# Patient Record
Sex: Male | Born: 1970 | Race: Black or African American | Hispanic: No | Marital: Single | Smoking: Current every day smoker
Health system: Southern US, Community
[De-identification: ages and names within clinical notes are randomized; demographics above are authoritative.]

## PROBLEM LIST (undated history)

## (undated) DIAGNOSIS — G4730 Sleep apnea, unspecified: Secondary | ICD-10-CM

---

## 1999-06-21 ENCOUNTER — Emergency Department (HOSPITAL_COMMUNITY): Admission: EM | Admit: 1999-06-21 | Discharge: 1999-06-21 | Payer: Self-pay | Admitting: Emergency Medicine

## 2000-03-25 ENCOUNTER — Emergency Department (HOSPITAL_COMMUNITY): Admission: EM | Admit: 2000-03-25 | Discharge: 2000-03-25 | Payer: Self-pay | Admitting: *Deleted

## 2001-04-07 ENCOUNTER — Encounter: Payer: Self-pay | Admitting: Emergency Medicine

## 2001-04-07 ENCOUNTER — Emergency Department (HOSPITAL_COMMUNITY): Admission: EM | Admit: 2001-04-07 | Discharge: 2001-04-07 | Payer: Self-pay | Admitting: Emergency Medicine

## 2008-12-11 ENCOUNTER — Emergency Department (HOSPITAL_COMMUNITY): Admission: EM | Admit: 2008-12-11 | Discharge: 2008-12-11 | Payer: Self-pay | Admitting: Emergency Medicine

## 2009-01-12 ENCOUNTER — Emergency Department (HOSPITAL_COMMUNITY): Admission: EM | Admit: 2009-01-12 | Discharge: 2009-01-12 | Payer: Self-pay | Admitting: Emergency Medicine

## 2009-01-16 ENCOUNTER — Ambulatory Visit (HOSPITAL_COMMUNITY): Admission: RE | Admit: 2009-01-16 | Discharge: 2009-01-16 | Payer: Self-pay | Admitting: Orthopedic Surgery

## 2009-04-30 ENCOUNTER — Emergency Department (HOSPITAL_COMMUNITY): Admission: EM | Admit: 2009-04-30 | Discharge: 2009-04-30 | Payer: Self-pay | Admitting: Emergency Medicine

## 2009-06-05 ENCOUNTER — Emergency Department (HOSPITAL_COMMUNITY): Admission: EM | Admit: 2009-06-05 | Discharge: 2009-06-05 | Payer: Self-pay | Admitting: Emergency Medicine

## 2009-07-07 ENCOUNTER — Ambulatory Visit: Payer: Self-pay | Admitting: Cardiology

## 2009-07-07 DIAGNOSIS — R55 Syncope and collapse: Secondary | ICD-10-CM

## 2009-07-22 ENCOUNTER — Ambulatory Visit (HOSPITAL_BASED_OUTPATIENT_CLINIC_OR_DEPARTMENT_OTHER): Admission: RE | Admit: 2009-07-22 | Discharge: 2009-07-22 | Payer: Self-pay | Admitting: Cardiology

## 2009-07-22 ENCOUNTER — Encounter: Payer: Self-pay | Admitting: Cardiology

## 2009-08-02 ENCOUNTER — Ambulatory Visit: Payer: Self-pay | Admitting: Pulmonary Disease

## 2009-08-10 ENCOUNTER — Ambulatory Visit: Payer: Self-pay

## 2009-08-10 ENCOUNTER — Encounter: Payer: Self-pay | Admitting: Cardiology

## 2009-08-10 ENCOUNTER — Ambulatory Visit: Payer: Self-pay | Admitting: Internal Medicine

## 2009-08-10 ENCOUNTER — Ambulatory Visit (HOSPITAL_COMMUNITY): Admission: RE | Admit: 2009-08-10 | Discharge: 2009-08-10 | Payer: Self-pay | Admitting: Cardiology

## 2009-08-11 ENCOUNTER — Telehealth: Payer: Self-pay | Admitting: Cardiology

## 2009-08-20 ENCOUNTER — Ambulatory Visit: Payer: Self-pay | Admitting: Pulmonary Disease

## 2009-08-20 DIAGNOSIS — G4733 Obstructive sleep apnea (adult) (pediatric): Secondary | ICD-10-CM | POA: Insufficient documentation

## 2009-08-24 ENCOUNTER — Encounter: Payer: Self-pay | Admitting: Pulmonary Disease

## 2009-08-25 LAB — CONVERTED CEMR LAB: TSH: 0.32 microintl units/mL — ABNORMAL LOW (ref 0.35–5.50)

## 2009-10-19 ENCOUNTER — Emergency Department (HOSPITAL_COMMUNITY): Admission: EM | Admit: 2009-10-19 | Discharge: 2009-10-19 | Payer: Self-pay | Admitting: Emergency Medicine

## 2009-10-28 ENCOUNTER — Ambulatory Visit: Payer: Self-pay | Admitting: Pulmonary Disease

## 2009-11-19 ENCOUNTER — Telehealth: Payer: Self-pay | Admitting: Pulmonary Disease

## 2009-12-04 IMAGING — CR DG KNEE COMPLETE 4+V*R*
4 series · 4 of 4 positions shown · non-contrast
Comparison: None

CLINICAL DATA: Fall with right knee pain.

RIGHT KNEE - COMPLETE 4+ VIEW

[t knee ap right]
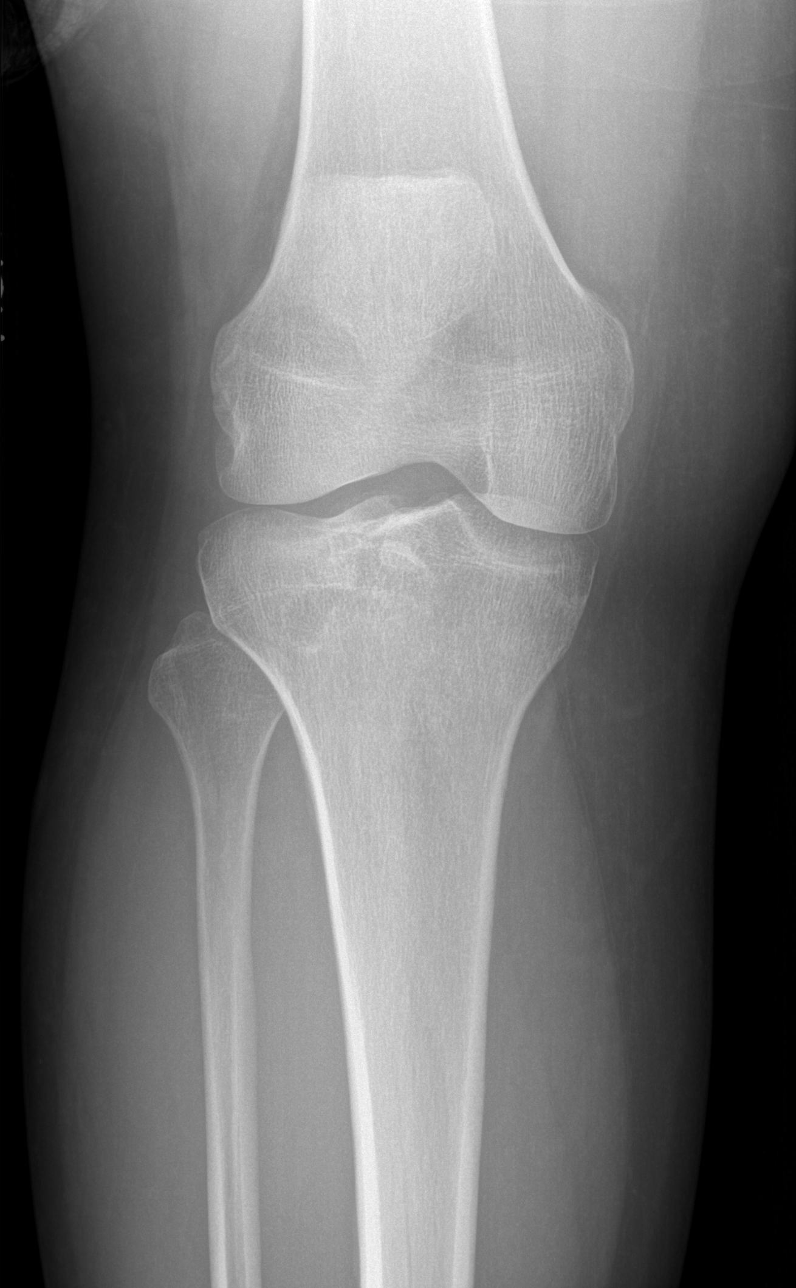

[t knee oblique right (1 of 2)]
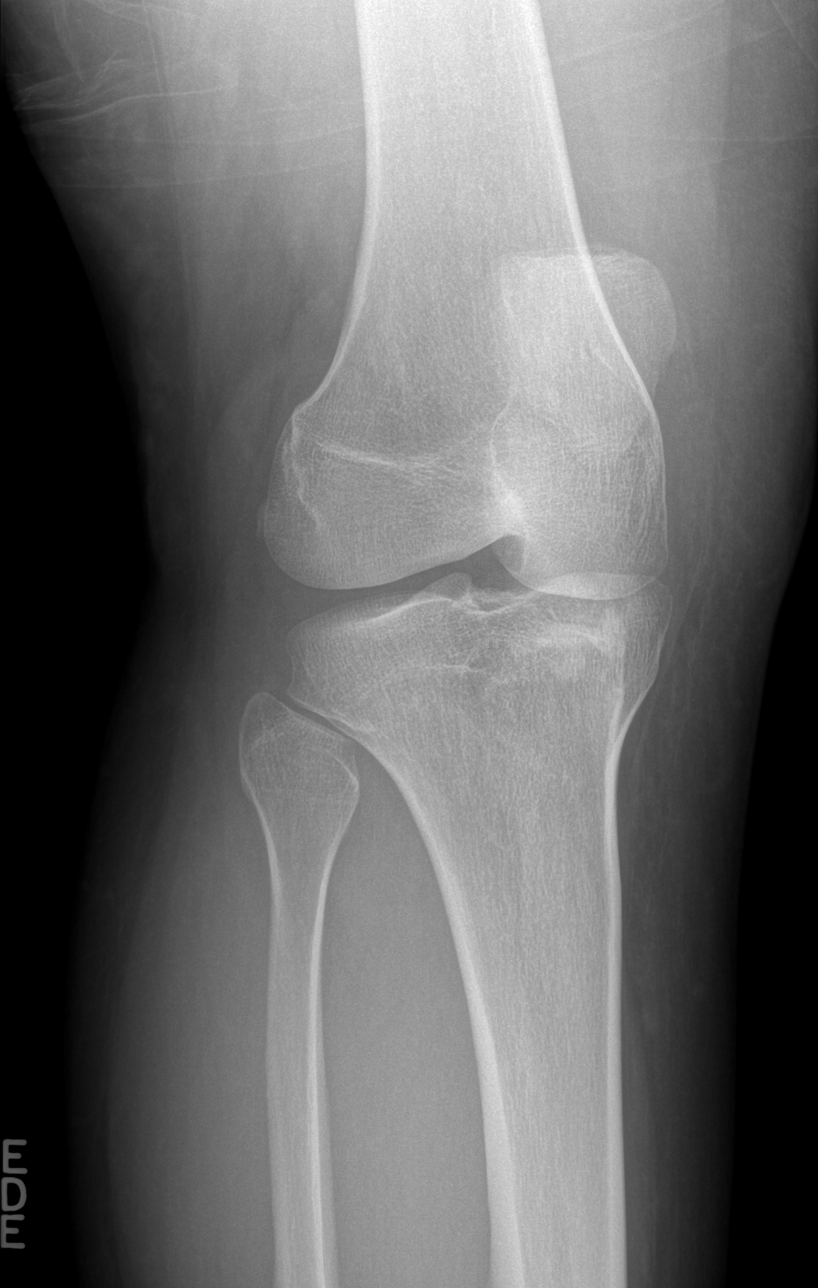

[t knee oblique right (2 of 2)]
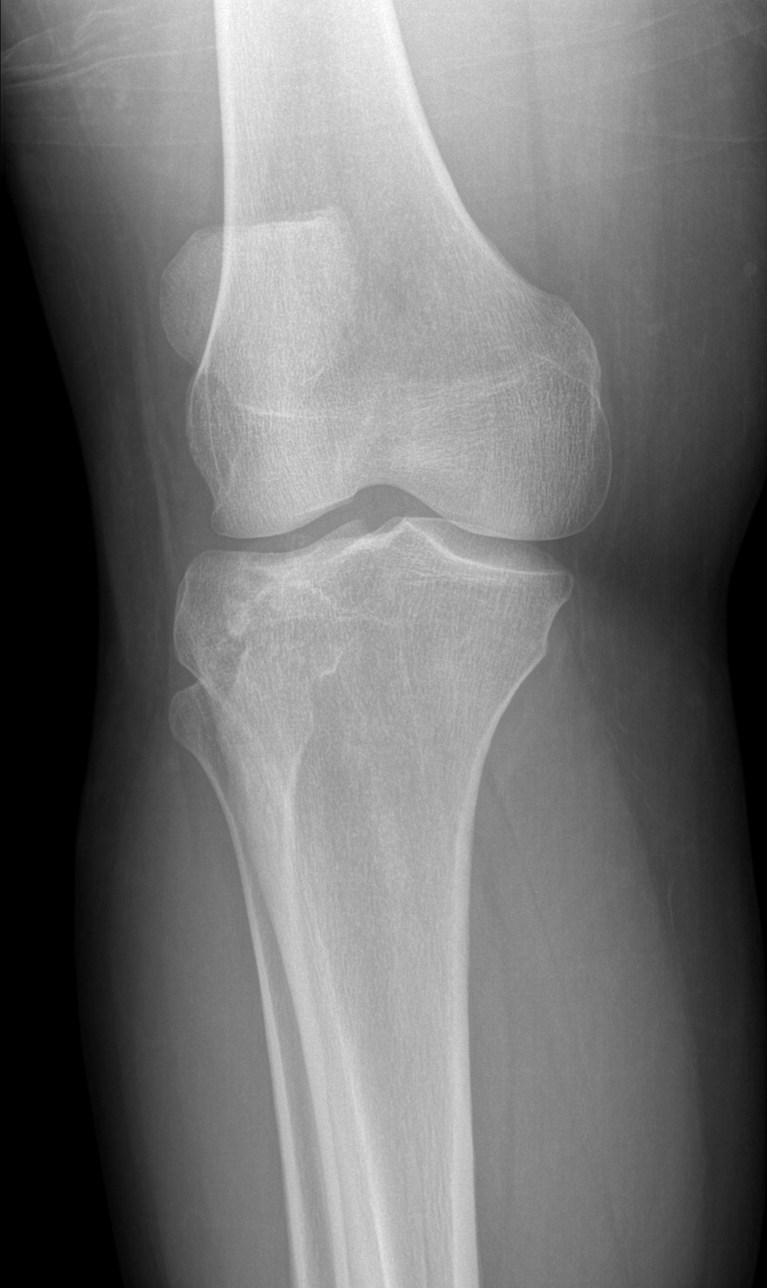

[t knee lat right]
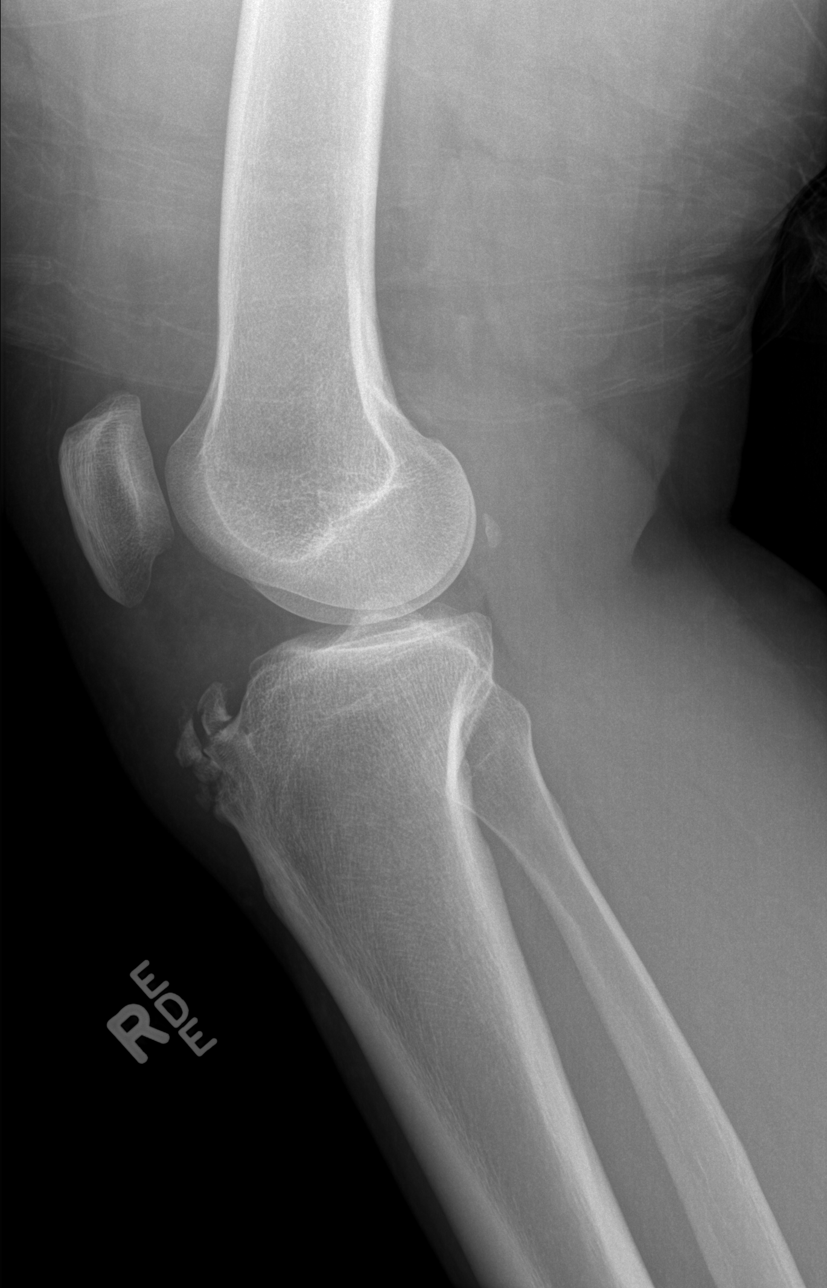

[4 of 4 positions shown; findings below may reference images not displayed]

FINDINGS: A fracture of the anterior tibial tubercle is noted.
Overlying soft tissue swelling is present.
There is no evidence of subluxation or dislocation.
There may be a very small knee effusion present.
IMPRESSION: Fracture of the anterior tibial tubercle.

## 2010-05-03 ENCOUNTER — Telehealth: Payer: Self-pay | Admitting: Pulmonary Disease

## 2010-07-13 NOTE — Assessment & Plan Note (Signed)
Summary: np6/see er report/jss   Primary Provider:  None   History of Present Illness: 40 yo presents for evaluation of syncope.  Patient has had 5-6 episodes of syncope.  Episodes have all been associated with either hard coughing or hard laughing.  Most recent episode was Christmas Eve.  He was sitting at a table with his girlfriend and got to laughing really hard.  He stood up still laughing and felt lightheaded.  He passed out while walking to the kitchen for a brief period.  Other episodes have occurred either during a "coughing fit" or when laughing really hard.  No micturation or defecation-induced syncope.  He has had a few episodes where he has been coughing and has felt lightheaded and laid down with resolution of the symptoms without syncope.    Patient reports significant daytime sleepiness.  His girlfriend says that he stops breathing sometimes during sleep then gasps.    ECG: NSR, normal  Current Medications (verified): 1)  None  Allergies (verified): No Known Drug Allergies  Past History:  Past Medical History: 1.  Obesity 2.  Syncope: cough and laughter induced situational syncope  Family History: No premature CAD  Social History: LIves with girlfriend.  Smokes < 1 ppd.  He is unemployed (laid off).    Review of Systems       All systems reviewed and negative except as per HPI.   Vital Signs:  Patient profile:   40 year old male Weight:      281.8 pounds Pulse rate:   95 / minute Pulse rhythm:   regular BP sitting:   140 / 89  (right arm)  Vitals Entered By: Minerva Areola, RN, BSN (July 07, 2009 12:39 PM)  Physical Exam  General:  Well developed, well nourished, in no acute distress.  Obese.  Head:  normocephalic and atraumatic Nose:  no deformity, discharge, inflammation, or lesions Mouth:  Teeth, gums and palate normal. Oral mucosa normal. Neck:  Neck supple, no JVD. No masses, thyromegaly or abnormal cervical nodes. Lungs:  Clear  bilaterally to auscultation and percussion. Heart:  Non-displaced PMI, chest non-tender; regular rate and rhythm, S1, S2 without murmurs, rubs or gallops. Carotid upstroke normal, no bruit.  Pedals normal pulses. No edema, no varicosities. Abdomen:  Bowel sounds positive; abdomen soft and non-tender without masses, organomegaly, or hernias noted. No hepatosplenomegaly. Msk:  Back normal, normal gait. Muscle strength and tone normal. Extremities:  No clubbing or cyanosis. Neurologic:  Alert and oriented x 3. Skin:  Intact without lesions or rashes. Psych:  Normal affect.   Impression & Recommendations:  Problem # 1:  SYNCOPE AND COLLAPSE (ICD-780.2) This patient has sItuational syncope that has been post-tussive or post-hard laughter.  This is a form of neurocardiogenic syncope.  The best way to prevent this in the future will be avoidance.  He should use a cough suppressant whenever a cough develops, and he will need to realize that laughing too strongly also could lead to syncope.  I talked to him about physical counterpressure maneuvers to use when he feels lightheaded (lying or sitting down, clinching fists and tightening leg muscles). I will get an echocardiogram to make sure that his heart is normal.    Problem # 2:  OSA Patient screens positive for OSA and has the usual body habitus.  He has limited funds at this time and wants to hold off on sleep study.    Problem # 3:  SMOKING I advised the patient to quit.  Other Orders: Misc. Referral (Misc. Ref) Echocardiogram (Echo)  Patient Instructions: 1)  Your physician has requested that you have an echocardiogram.  Echocardiography is a painless test that uses sound waves to create images of your heart. It provides your doctor with information about the size and shape of your heart and how well your heart's chambers and valves are working.  This procedure takes approximately one hour. There are no restrictions for this procedure. 2)   Your physician has recommended that you have a sleep study.  This test records several body functions during sleep, including:  brain activity, eye movement, oxygen and carbon dioxide blood levels, heart rate and rhythm, breathing rate and rhythm, the flow of air through your mouth and nose, snoring, body muscle movements, and chest and belly movement. 3)  Your physician recommends that you schedule a follow-up appointment in: as needed with Dr. Marca Ancona

## 2010-07-13 NOTE — Progress Notes (Signed)
Summary: talk to nurse-  Phone Note Call from Patient Call back at Home Phone [phone]   Caller: Patient Call For: Krystian Ferrentino Reason for Call: Talk to Nurse Summary of Call: patient has a cpap machince and dr Dierdra Salameh bummped the numbers back up to 18. he said since it was moved up, he has been very uncomfortable. he wants to talk to a nurse about this.  Initial call taken by: Valinda Hoar,  November 19, 2009 12:08 PM  Follow-up for Phone Call        [facility]. Carron Curie CMA  November 19, 2009 1:11 PM  Pt states since his pressure has been increased to 18 he has been having alot of burping when he is wearing the mask, and he states he has re adjusted the mask several times but he is now having leaks around the mask. Pt is using a full face mask. Please advise.Carron Curie CMA  November 19, 2009 1:18 PM   Additional Follow-up for Phone Call Additional follow up Details #1::        let pt know that order sent to [facility] to decrease back to 16.  He is to call us if still having issues at this pressure. Additional Follow-up by: Barbaraann Share MD,  November 19, 2009 1:50 PM    Additional Follow-up for Phone Call Additional follow up Details #2::    Pt aware. Carron Curie CMA  November 19, 2009 2:10 PM

## 2010-07-13 NOTE — Progress Notes (Signed)
Summary:  sleep study results  Phone Note Outgoing Call   Call placed by: Katina Dung, RN, BSN,  August 11, 2009 6:20 PM Call placed to: Patient Summary of Call: sleep study  Follow-up for Phone Call        sleep study 07-22-09(the report is on Dr Alford Highland cart) reviewed by Dr Edgard Debord--recommended referral to Dr Shelle Iron for management of sleep apnea --LMVM  returning call, call back @ [phone], Migdalia Dk  August 12, 2009 9:24 AM    Additional Follow-up for Phone Call Additional follow up Details #1::        Called patient and left message on machine  Duncan Dull, RN, BSN  August 12, 2009 11:19 AM     Additional Follow-up for Phone Call Additional follow up Details #2::    Pt aware and does wish to see Dr. Shelle Iron. Order placed. Pt transferred to [facility] for appt.  Follow-up by: Duncan Dull, RN, BSN,  August 12, 2009 12:15 PM

## 2010-07-13 NOTE — Progress Notes (Signed)
Summary: nos appt  Phone Note Call from Patient   Caller: [email or web address]  Call For: clance Summary of Call: ATC pt to [facility] nos from 11/18, restricted phone number. Initial call taken by: Darletta Moll,  May 03, 2010 9:49 AM

## 2010-07-13 NOTE — Assessment & Plan Note (Signed)
Summary: rov for osa   Copy to:  Marca Ancona  Primary Provider/Referring Provider:  None  CC:  Pt is here for a f/u appt.  Pt states he has lost approx 20 lbs.  Pt states he just switched jobs approx 2 weeks ago and is now working nights.  Pt states he is wearing his cpap machine every  day when he goes to sleep.  Wears for approx 6 hours.  Pt states he still wakes up not feeling rested but wonders if that's d/t the change in jobs.  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 .  History of Present Illness: the pt comes in today for f/u of his known osa.  He is wearing cpap compliantly, and has seen some improvement in his sleep and daytime symptoms, but continue to have some sleepiness issues.  I have reminded him that we have not set his machine on optimal pressure as of yet.  He has had mask issues related to a skin infection on his face, and had to temporarily go to nasal mask until it heals.  He reports no issues with pressure currently.  He has lost 20 pounds since last visit!  Current Medications (verified): 1)  Bactrim .... Take 1 Tablet By Mouth Once A Day 2)  Antibiotic .... Take 1 Tablet By Mouth Once A Day  Allergies (verified): No Known Drug Allergies  Review of Systems      See HPI  Vital Signs:  Patient profile:   40 year old male Height:      66 inches Weight:      272 pounds O2 Sat:      95 % on Room air Temp:     98.1 degrees F oral Pulse rate:   99 / minute BP sitting:   134 / 82  (left arm) Cuff size:   large  Vitals Entered By: Arman Filter LPN (Oct 28, 2009 2:45 PM)  O2 Flow:  Room air CC: Pt is here for a f/u appt.  Pt states he has lost approx 20 lbs.  Pt states he just switched jobs approx 2 weeks ago and is now working nights.  Pt states he is wearing his cpap machine every  day when he goes to sleep.  Wears for approx 6 hours.  Pt states he still wakes up not  feeling rested but wonders if that's d/t the change in jobs.  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  Comments Medications reviewed with patient Arman Filter LPN  Oct 28, 2009 2:46 PM    Physical Exam  General:  obese male in nad Nose:  no skin breakdown or pressure necrosis from cpap mask Extremities:  + edema, no cyanosis Neurologic:  alert and oriented, not sleepy, moves all 4.   Impression & Recommendations:  Problem # 1:  OBSTRUCTIVE SLEEP APNEA (ICD-327.23) the pt is doing well with his treatment of osa.  He has lost 20 pounds, and is wearing the device compliantly.  We need to get him increased to his optimal pressure, and he needs to continue working on weight loss.  I have also asked him to go back to his full face mask as quickly as possible.  He is to f/u with me in 6mos.  Medications Added to Medication List This Visit: 1)  Bactrim  .... Take 1 tablet by mouth once a day 2)  Antibiotic  .... Take 1 tablet by mouth once a day  Other Orders: Est. Patient Level III (29528) DME Referral (DME)  Patient Instructions: 1)  continue to work on weight loss 2)  will have cpap increased to 18. 3)  get back to full face mask once your wound heals up 4)  followup with me in 6mos or sooner if problems.

## 2010-07-13 NOTE — Miscellaneous (Signed)
Summary: Orders Update  Clinical Lists Changes  Orders: Added new Test order of TLB-T3, Free (Triiodothyronine) (84481-T3FREE) - Signed Added new Test order of TLB-T4 (Thyrox), Free [phone]) - Signed

## 2010-07-13 NOTE — Assessment & Plan Note (Signed)
Summary: consult for osa   Copy to:  Eric Acevedo  Primary Provider/Referring Provider:  None  CC:  Sleep Consult.  History of Present Illness: the pt is a 40y/o male who I have been asked to see for osa.  He has had a recent sleep study, and was found to have severe osa with AHI 110/hr and desat to 78%.  He was started on cpap, and titrated to an optimal pressure of 19-20cm of water.  The pt has been noted to have loud snoring as well as pauses in his breathing during sleep.  He goes to bed around 8pm, and arises at 6am to start his day.  He is not rested upon arising, and will fall asleep with any period of inactivity.  He does have some sleepiness with driving intermittantly.  His weight is up 30 pounds over the last 2 years, and his epworth score is 12.    Preventive Screening-Counseling & Management  Alcohol-Tobacco     Smoking Status: current  Current Medications (verified): 1)  No Prescription Medications  Allergies (verified): No Known Drug Allergies  Past History:  Past Medical History: 1.  Obesity 2.  Syncope: cough and laughter induced situational syncope OBSTRUCTIVE SLEEP APNEA (ICD-327.23)    Past Surgical History: B eye lid surgery as a baby  Family History: Reviewed history from 07/07/2009 and no changes required. No premature CAD  allergies: mother clotting disorders: father  Social History: Reviewed history from 07/07/2009 and no changes required. Pt is currently separated. LIves with father  He is unemployed (laid off).   Patient is a current smoker.   started at age 66.  1/2 ppd.   Smoking Status:  current  Review of Systems       The patient complains of shortness of breath with activity, shortness of breath at rest, productive cough, acid heartburn, indigestion, nasal congestion/difficulty breathing through nose, and sneezing.  The patient denies non-productive cough, coughing up blood, chest pain, irregular heartbeats, loss of appetite, weight  change, abdominal pain, difficulty swallowing, sore throat, tooth/dental problems, headaches, itching, ear ache, anxiety, depression, hand/feet swelling, joint stiffness or pain, rash, change in color of mucus, and fever.    Vital Signs:  Patient profile:   40 year old male Height:      66 inches Weight:      291.38 pounds BMI:     47.20 O2 Sat:      97 % on Room air Temp:     98.1 degrees F oral Pulse rate:   95 / minute BP sitting:   120 / 84  (right arm) Cuff size:   large  Vitals Entered By: Arman Filter LPN (August 20, 2009 9:07 AM)  O2 Flow:  Room air CC: Sleep Consult Comments Medications reviewed with patient Arman Filter LPN  August 20, 2009 9:17 AM    Physical Exam  General:  morbidly obese male in nad Eyes:  PERRLA and EOMI.   Nose:  patent without discharge. Mouth:  very large "kissing tonsils", elongation of soft palate and uvula. Neck:  ? right sided enlarged thyroid, no LN or jvd Lungs:  clear to auscultation Heart:  rrr, no mrg Abdomen:  soft and nontender, bs+ Extremities:  no edema, pulses intact distally, no cyanosis Neurologic:  alert and oriented, moves all 4.   Impression & Recommendations:  Problem # 1:  OBSTRUCTIVE SLEEP APNEA (ICD-327.23) the pt has very severe osa by his recent sleep study.  He is morbidly obese and  has very large tonsils.  His pressure requirements are very high, and I have explained to him we have no room for error.  If he gains further weight, may be looking at trach.  We could consider tonsillectomy, UP3 to help reduce pressure needs (will not cure his osa), but the better suggestion is weight loss.  I have had a long discussion about the health risk this represents.  Will check tsh today, and refer to dme for initiation of cpap.  I have also reminded him of his moral responsibility to not drive if sleepy.  Medications Added to Medication List This Visit: 1)  No Prescription Medications   Other Orders: Consultation Level IV  (16109) DME Referral (DME) TLB-TSH (Thyroid Stimulating Hormone) (84443-TSH)  Patient Instructions: 1)  will set up on cpap.  Call if having tolerance issues. 2)  will check thyroid tests today 3)  work on weight loss 4)  followup with me in 5 weeks.

## 2010-08-31 LAB — WOUND CULTURE

## 2010-09-20 LAB — BASIC METABOLIC PANEL
Calcium: 8.8 mg/dL (ref 8.4–10.5)
Chloride: 103 mEq/L (ref 96–112)
Creatinine, Ser: 0.92 mg/dL (ref 0.4–1.5)
GFR calc Af Amer: >60 mL/min (ref >60)

## 2010-09-20 LAB — URINALYSIS, ROUTINE W REFLEX MICROSCOPIC
Glucose, UA: NEGATIVE mg/dL
Protein, ur: NEGATIVE mg/dL
Urobilinogen, UA: 1 mg/dL (ref 0.0–1.0)

## 2010-09-20 LAB — DIFFERENTIAL
Lymphocytes Relative: 32 % (ref 12–46)
Lymphs Abs: 3.4 10*3/uL (ref 0.7–4.0)
Monocytes Relative: 6 % (ref 3–12)
Neutro Abs: 6.1 10*3/uL (ref 1.7–7.7)
Neutrophils Relative %: 58 % (ref 43–77)

## 2010-09-20 LAB — CBC
RBC: 4.53 MIL/uL (ref 4.22–5.81)
WBC: 10.4 10*3/uL (ref 4.0–10.5)

## 2011-04-20 ENCOUNTER — Emergency Department (HOSPITAL_COMMUNITY)
Admission: EM | Admit: 2011-04-20 | Discharge: 2011-04-20 | Disposition: A | Discharge status: Home / Self Care | Payer: Self-pay | Attending: Emergency Medicine | Admitting: Emergency Medicine

## 2011-04-20 ENCOUNTER — Encounter: Payer: Self-pay | Admitting: *Deleted

## 2011-04-20 DIAGNOSIS — F17200 Nicotine dependence, unspecified, uncomplicated: Secondary | ICD-10-CM | POA: Insufficient documentation

## 2011-04-20 DIAGNOSIS — K089 Disorder of teeth and supporting structures, unspecified: Secondary | ICD-10-CM | POA: Insufficient documentation

## 2011-04-20 DIAGNOSIS — K0889 Other specified disorders of teeth and supporting structures: Secondary | ICD-10-CM

## 2011-04-20 DIAGNOSIS — K029 Dental caries, unspecified: Secondary | ICD-10-CM | POA: Insufficient documentation

## 2011-04-20 DIAGNOSIS — R51 Headache: Secondary | ICD-10-CM | POA: Insufficient documentation

## 2011-04-20 HISTORY — DX: Sleep apnea, unspecified: G47.30

## 2011-04-20 MED ORDER — PENICILLIN V POTASSIUM 500 MG PO TABS: 500.0000 mg | ORAL_TABLET | Freq: Four times a day (QID) | ORAL | Status: AC

## 2011-04-20 MED ORDER — OXYCODONE-ACETAMINOPHEN 5-325 MG PO TABS
2.0000 | ORAL_TABLET | Freq: Once | ORAL | Status: AC
  Administered 2011-04-20: 2 via ORAL
  Filled 2011-04-20: qty 2

## 2011-04-20 MED ORDER — OXYCODONE-ACETAMINOPHEN 5-325 MG PO TABS: 2.0000 | ORAL_TABLET | ORAL | Status: AC | PRN

## 2011-04-20 NOTE — ED Provider Notes (Signed)
History     CSN: [id number] Arrival date & time: 04/20/2011  7:42 AM   First MD Initiated Contact with Patient 04/20/11 0801      Chief Complaint  Patient presents with  . Dental Pain  . Migraine    (Consider location/radiation/quality/duration/timing/severity/associated sxs/prior treatment) Patient is a 40 y.o. male presenting with tooth pain. The history is provided by the patient.  Dental Pain  warts developing a cavity in his right upper wisdom tooth approximately 2 months ago and since his continue to have some pain.  He presents now with worsening pain and headache associated with his right upper wisdom tooth pain.  Denies fever he denies chills he denies difficulty swallowing.  Nothing worsens the symptoms.  Nothing improves his symptoms.  He has tried ibuprofen without any improvement.  He has not seen a dentist to have this evaluated.  His pain is moderate to severe.  Past Medical History  Diagnosis Date  . Sleep apnea     History reviewed. No pertinent past surgical history.  History reviewed. No pertinent family history.  History  Substance Use Topics  . Smoking status: Current Everyday Smoker -- 0.5 packs/day  . Smokeless tobacco: Not on file  . Alcohol Use: Yes      Review of Systems  All other systems reviewed and are negative.    Allergies  Review of patient's allergies indicates no known allergies.  Home Medications   Current Outpatient Rx  Name Route Sig Dispense Refill  . IBUPROFEN 200 MG PO TABS Oral Take 200 mg by mouth every 8 (eight) hours as needed. pain     . IBUPROFEN-DIPHENHYDRAMINE CIT 200-38 MG PO TABS Oral Take 2 capsules by mouth at bedtime as needed. pain     . THERA M PLUS PO TABS Oral Take 1 tablet by mouth daily. One a day gummy vitamin       BP 142/83  Pulse 85  Temp(Src) 98.4 F (36.9 C) (Oral)  Resp 18  SpO2 98%  Physical Exam  Constitutional: He is oriented to person, place, and time. He appears well-developed and  well-nourished.  HENT:  Head: Normocephalic.       Third molar with evidence of dental decay.  No gingival swelling or tenderness.  Patient has no facial swelling associated with this infection.  Otherwise normal dentition no malocclusion or trismus normal posterior pharynx a normal bilateral TMs and ears  Eyes: EOM are normal.  Neck: Normal range of motion.  Pulmonary/Chest: Effort normal.  Musculoskeletal: Normal range of motion.  Neurological: He is alert and oriented to person, place, and time.  Psychiatric: He has a normal mood and affect.    ED Course  Procedures (including critical care time)  Labs Reviewed - No data to display No results found.   1. Pain, dental       MDM  Dental Pain. Home with antibiotics and pain medicine. Recommend dental follow up. No signs of gingival abscess. Tolerating secretions. Airway patent. No sub lingular swelling         Lyanne Co, MD 04/20/11 [phone]

## 2011-04-20 NOTE — ED Notes (Signed)
Pt states "My right upper wisdom tooth broke off about 2 months ago and now it's causing me to have a h/a"

## 2012-09-17 ENCOUNTER — Ambulatory Visit: Payer: Self-pay | Admitting: Physician Assistant

## 2012-09-17 VITALS — BP 120/78 | HR 96 | Temp 98.1°F | Resp 16 | Ht 68.25 in | Wt 210.0 lb

## 2012-09-17 DIAGNOSIS — Z0289 Encounter for other administrative examinations: Secondary | ICD-10-CM

## 2012-09-17 NOTE — Progress Notes (Signed)
  Subjective:    Patient ID: Eric Acevedo, male    DOB: Mar 03, 1971, 42 y.o.   MRN: [id number]  HPI 42 year old male presents for DOT PE.  No daily medications.  Has been diagnosed with sleep apnea for which he uses a C-pap every night.  Tolerating well.  Does continue to smoke about 1/2 ppd x 15 years.  Otherwise healthy with no other complaints today.  Does not have a PCP for regular well checks.      Review of Systems  Constitutional: Negative for fever, chills and fatigue.  HENT: Negative for ear pain.   Respiratory: Negative for cough and shortness of breath.   Cardiovascular: Negative for chest pain and palpitations.  Skin: Negative for rash.  Neurological: Negative for dizziness and headaches.       Objective:   Physical Exam  Constitutional: He is oriented to person, place, and time. He appears well-developed and well-nourished.  HENT:  Head: Normocephalic and atraumatic.  Right Ear: Hearing, tympanic membrane, external ear and ear canal normal.  Left Ear: Hearing, tympanic membrane, external ear and ear canal normal.  Mouth/Throat: Uvula is midline, oropharynx is clear and moist and mucous membranes are normal. No oropharyngeal exudate (2+ hypertrophic tonsils).  Eyes: Conjunctivae and EOM are normal. Pupils are equal, round, and reactive to light.  Neck: Normal range of motion. Neck supple. No thyromegaly present.  Cardiovascular: Normal rate, regular rhythm and normal heart sounds.   Pulmonary/Chest: Effort normal and breath sounds normal.  Abdominal: Soft. Bowel sounds are normal. There is no tenderness. There is no rebound and no guarding. Hernia confirmed negative in the right inguinal area and confirmed negative in the left inguinal area.  Lymphadenopathy:    He has no cervical adenopathy.  Neurological: He is alert and oriented to person, place, and time.  Psychiatric: He has a normal mood and affect. His behavior is normal. Judgment and thought content normal.      Spirometry in office >75% (normal)     Assessment & Plan:  Health examination of defined subpopulation  Patient to bring last 2 weeks of readings from C-pap machine.   Upon receipt of these, ok to complete form for 2 year card.   Recommend establishing with PCP for annual physical with labwork.   Encouraged smoking cessation.

## 2012-09-18 ENCOUNTER — Telehealth: Payer: Self-pay

## 2012-09-18 NOTE — Telephone Encounter (Signed)
Patient says that we were waiting on a fax from [facility] regarding his dot pe and his cpap machine please call patient if we have received this paper from them at [phone]

## 2012-09-19 NOTE — Telephone Encounter (Signed)
Called and left message to advise

## 2012-09-19 NOTE — Telephone Encounter (Signed)
Have we gotten this information?

## 2012-09-19 NOTE — Telephone Encounter (Signed)
Spoke to patient and let him know that it did arrive and that it will be in [facility] box today.

## 2012-09-19 NOTE — Telephone Encounter (Signed)
Form and card completed and ready for pickup.

## 2012-09-29 ENCOUNTER — Encounter: Payer: Self-pay | Admitting: Physician Assistant

## 2013-08-17 ENCOUNTER — Encounter (HOSPITAL_COMMUNITY): Payer: Self-pay | Admitting: Emergency Medicine

## 2013-08-17 ENCOUNTER — Emergency Department (HOSPITAL_COMMUNITY)
Admission: EM | Admit: 2013-08-17 | Discharge: 2013-08-17 | Disposition: A | Discharge status: Home / Self Care | Payer: No Typology Code available for payment source | Attending: Emergency Medicine | Admitting: Emergency Medicine

## 2013-08-17 DIAGNOSIS — Y9389 Activity, other specified: Secondary | ICD-10-CM | POA: Insufficient documentation

## 2013-08-17 DIAGNOSIS — M545 Low back pain, unspecified: Secondary | ICD-10-CM | POA: Insufficient documentation

## 2013-08-17 DIAGNOSIS — G4733 Obstructive sleep apnea (adult) (pediatric): Secondary | ICD-10-CM | POA: Insufficient documentation

## 2013-08-17 DIAGNOSIS — M549 Dorsalgia, unspecified: Secondary | ICD-10-CM

## 2013-08-17 DIAGNOSIS — F17200 Nicotine dependence, unspecified, uncomplicated: Secondary | ICD-10-CM | POA: Insufficient documentation

## 2013-08-17 DIAGNOSIS — Y92410 Unspecified street and highway as the place of occurrence of the external cause: Secondary | ICD-10-CM | POA: Insufficient documentation

## 2013-08-17 DIAGNOSIS — M542 Cervicalgia: Secondary | ICD-10-CM | POA: Insufficient documentation

## 2013-08-17 MED ORDER — METHOCARBAMOL 500 MG PO TABS: 500.0000 mg | ORAL_TABLET | Freq: Two times a day (BID) | ORAL | Status: DC | PRN

## 2013-08-17 MED ORDER — IBUPROFEN 600 MG PO TABS: 600.0000 mg | ORAL_TABLET | Freq: Four times a day (QID) | ORAL | Status: DC | PRN

## 2013-08-17 NOTE — Discharge Instructions (Signed)
Take the prescribed medication as directed.  May wish to apply heat to low back to help with pain and soreness. Return to the ED for new or worsening symptoms.
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
## 2013-08-17 NOTE — ED Provider Notes (Signed)
Medical screening examination/treatment/procedure(s) were performed by non-physician practitioner and as supervising physician I was immediately available for consultation/collaboration.   EKG Interpretation None        Layla MawKristen N Capucine Tryon, DO 08/17/13 2259

## 2013-10-21 ENCOUNTER — Telehealth: Payer: Self-pay | Admitting: Cardiology

## 2013-10-21 NOTE — Telephone Encounter (Signed)
Called and informed patient that his CPAP was likely ordered by Dr. Shelle Ironlance.  Patient verbalized agreement and I gave him Dr. Teddy Spikelance's phone number [phone]).  Patient thanked me for the call.

## 2013-10-21 NOTE — Telephone Encounter (Signed)
New message     Pt said he has not been seen in a while---cannot remember doctor he saw----need CPAP machine adjusted.  Home health said they need a doctor's order

## 2013-10-24 ENCOUNTER — Institutional Professional Consult (permissible substitution): Payer: Self-pay | Admitting: Pulmonary Disease

## 2014-11-29 ENCOUNTER — Ambulatory Visit (INDEPENDENT_AMBULATORY_CARE_PROVIDER_SITE_OTHER): Payer: Self-pay | Admitting: Family Medicine

## 2014-11-29 ENCOUNTER — Ambulatory Visit: Payer: Worker's Compensation

## 2014-11-29 VITALS — BP 134/90 | HR 81 | Temp 97.6°F | Resp 18 | Ht 67.5 in | Wt 318.8 lb

## 2014-11-29 DIAGNOSIS — M6281 Muscle weakness (generalized): Secondary | ICD-10-CM

## 2014-11-29 DIAGNOSIS — S43004A Unspecified dislocation of right shoulder joint, initial encounter: Secondary | ICD-10-CM

## 2014-11-29 DIAGNOSIS — R531 Weakness: Secondary | ICD-10-CM

## 2014-11-29 NOTE — Patient Instructions (Signed)
Continue pains meds. You will get a phone call to make appointment with ortho. Out of work until further notice.

## 2014-11-29 NOTE — Progress Notes (Signed)
[facility] [address], [place] [address] [phone]  Date:  11/29/2014   Name:  Eric Acevedo   DOB:  Sep 12, 1970   MRN:  [id number]  PCP:  No primary care provider on file.    Chief Complaint: Work Related Injury   History of Present Illness:  This is a 44 y.o. right hand dominant male who is presenting with right arm injury that occurred 11/26/14 while at work. 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 States he fell and braced himself with his right arm extended. Went to the hospital and was dx'd with a right shoulder dislocation - no fx seen on radiograph. He was put under anesthesia and dislocation was reduced. He was fit for a sling and was told he needed to follow up with ortho. Worker's comp told him he needed to be seen here in order to get referral. Pt reports he has had a lot of pain since injury. Complaining of numbness on his outer upper arm. He has norco at home. He has decreased ROM.   Review of Systems:  Review of Systems See HPI.  Prior to Admission medications   Medication Sig Start Date End Date Taking? Authorizing Provider  HYDROcodone-acetaminophen (NORCO/VICODIN) 5-325 MG per tablet Take 1 tablet by mouth every 6 (six) hours as needed for moderate pain.   Yes Historical Provider, MD  ibuprofen (ADVIL,MOTRIN) 800 MG tablet Take 800 mg by mouth every 8 (eight) hours as needed.   Yes Historical Provider, MD    No Known Allergies  History reviewed. No pertinent past surgical history.  History  Substance Use Topics  . Smoking status: Current Every Day Smoker -- 0.50 packs/day  . Smokeless tobacco: Not on file  . Alcohol Use: Yes    History reviewed. No pertinent family history.  Medication list has been reviewed and updated.  Physical Examination:  Physical Exam  Constitutional: He is oriented to person, place, and time. He appears well-developed and well-nourished. No distress.  HENT:  Head: Normocephalic and  atraumatic.  Right Ear: Hearing normal.  Left Ear: Hearing normal.  Nose: Nose normal.  Eyes: Conjunctivae and lids are normal. Right eye exhibits no discharge. Left eye exhibits no discharge. No scleral icterus.  Cardiovascular: Normal rate, regular rhythm, normal heart sounds and normal pulses.   No murmur heard. Pulmonary/Chest: Effort normal and breath sounds normal. No respiratory distress. He has no wheezes. He has no rhonchi. He has no rales.  Musculoskeletal:       Right shoulder: He exhibits decreased range of motion (30 degrees active flexion and abduction), tenderness (general over shoulder and upper arm) and decreased strength (4/5 resisted flexion, 3/5 resisted abduction). He exhibits no bony tenderness, no swelling, no deformity and normal pulse.       Left shoulder: Normal.       Right elbow: Normal.      Left elbow: Normal.       Right wrist: Normal.       Left wrist: Normal.  5/5 grip strength  Neurological: He is alert and oriented to person, place, and time. No sensory deficit.  Skin: Skin is warm, dry and intact. No lesion and no rash noted.  Psychiatric: He has a normal mood and affect. His speech is normal and behavior is normal. Thought content normal.   BP 134/90 mmHg  Pulse 81  Temp(Src) 97.6 F (36.4 C) (Oral)  Resp 18  Ht 5' 7.5" (  1.715 m)  Wt 318 lb 12.8 oz (144.607 kg)  BMI 49.17 kg/m2  SpO2 98%  [facility] reading (PRIMARY) by  Dr. Katrinka Blazing: negative  Assessment and Plan:  1. Shoulder dislocation, right, initial encounter 2. Decreased strength Suspect possible rotator cuff injury d/t decreased strength. Pt fit for new sling that fits better. Continue home pain meds. Referred to ortho. OOW until ortho eval.  - DG Shoulder Right; Future - Ambulatory referral to Orthopedic Surgery  Roswell Miners. Dyke Brackett, MHS [facility]  11/29/2014

## 2014-11-30 NOTE — Progress Notes (Signed)
History and physical examinations reviewed with PA Bush.  Xrays reviewed during visit and negative.  Agree with assessment and plan. Juandaniel Manfredo Paulita Fujita, M.D. [facility] [address] [place], [place]  [address] [phone] phone [phone] fax

## 2015-04-10 ENCOUNTER — Encounter (HOSPITAL_COMMUNITY): Payer: Self-pay | Admitting: Emergency Medicine

## 2015-04-10 ENCOUNTER — Emergency Department (HOSPITAL_COMMUNITY)
Admission: EM | Admit: 2015-04-10 | Discharge: 2015-04-10 | Disposition: A | Discharge status: Home / Self Care | Payer: Self-pay | Attending: Emergency Medicine | Admitting: Emergency Medicine

## 2015-04-10 DIAGNOSIS — L03116 Cellulitis of left lower limb: Secondary | ICD-10-CM | POA: Insufficient documentation

## 2015-04-10 DIAGNOSIS — Z720 Tobacco use: Secondary | ICD-10-CM | POA: Insufficient documentation

## 2015-04-10 DIAGNOSIS — Z8669 Personal history of other diseases of the nervous system and sense organs: Secondary | ICD-10-CM | POA: Insufficient documentation

## 2015-04-10 MED ORDER — SULFAMETHOXAZOLE-TRIMETHOPRIM 800-160 MG PO TABS
1.0000 | ORAL_TABLET | Freq: Once | ORAL | Status: AC
  Administered 2015-04-10: 1 via ORAL
  Filled 2015-04-10: qty 1

## 2015-04-10 MED ORDER — SULFAMETHOXAZOLE-TRIMETHOPRIM 800-160 MG PO TABS: 1.0000 | ORAL_TABLET | Freq: Two times a day (BID) | ORAL | Status: AC

## 2015-04-10 NOTE — ED Notes (Signed)
Pt has dry, itching lesion to left lower leg x 3 weeks. No known cause.

## 2015-04-10 NOTE — Discharge Instructions (Signed)
Take bactrim as directed until gone. Refer to attached documents for more information. Follow up with the recommended Primary care provider for further evaluation.

## 2015-04-10 NOTE — ED Provider Notes (Signed)
CSN: [id number]     Arrival date & time 04/10/15  4098 History  By signing my name below, I, Elon Spanner, attest that this documentation has been prepared under the direction and in the presence of [facility], PA-C. Electronically Signed: Elon Spanner ED Scribe. 04/10/2015. 9:29 AM.    Chief Complaint  Patient presents with  . Wound Check   The history is provided by the patient. No language interpreter was used.   HPI Comments: Eric Acevedo is a 44 y.o. male who presents to the Emergency Department complaining of an itching, non-painful lesion on the left lateral mid lower leg onset 3 weeks ago; no improvement with neosporin or ring-worm cream.  He suspects that this complaint onset after wearing a pair of pants from a thrift store without washing them first.  He denies fever, chills, abdominal pain, bites, other complaints.    Past Medical History  Diagnosis Date  . Sleep apnea    No past surgical history on file. No family history on file. Social History  Substance Use Topics  . Smoking status: Current Every Day Smoker -- 0.50 packs/day  . Smokeless tobacco: Not on file  . Alcohol Use: Yes    Review of Systems  Constitutional: Negative for fever and chills.  Gastrointestinal: Negative for abdominal pain.  Skin: Positive for wound.  All other systems reviewed and are negative.     Allergies  Review of patient's allergies indicates no known allergies.  Home Medications   Prior to Admission medications   Medication Sig Start Date End Date Taking? Authorizing Provider  HYDROcodone-acetaminophen (NORCO/VICODIN) 5-325 MG per tablet Take 1 tablet by mouth every 6 (six) hours as needed for moderate pain.    Historical Provider, MD  ibuprofen (ADVIL,MOTRIN) 800 MG tablet Take 800 mg by mouth every 8 (eight) hours as needed.    Historical Provider, MD   BP 150/107 mmHg  Pulse 83  Temp(Src) 97.9 F (36.6 C) (Oral)  Resp 17  SpO2 100% Physical Exam   Constitutional: He is oriented to person, place, and time. He appears well-developed and well-nourished. No distress.  HENT:  Head: Normocephalic and atraumatic.  Eyes: Conjunctivae and EOM are normal.  Neck: Neck supple. No tracheal deviation present.  Cardiovascular: Normal rate.   Pulmonary/Chest: Effort normal. No respiratory distress.  Abdominal: Soft. He exhibits no distension. There is no tenderness. There is no rebound.  Musculoskeletal: Normal range of motion.  Neurological: He is alert and oriented to person, place, and time. Coordination normal.  Skin: Skin is warm and dry.  Erythematous and mildly tender 3x3 cm area of posterior left lower leg. No fluctuance or open wound. No drainage.   Psychiatric: He has a normal mood and affect. His behavior is normal.  Nursing note and vitals reviewed.   ED Course  Procedures (including critical care time)  DIAGNOSTIC STUDIES: Oxygen Saturation is 100% on RA, normal by my interpretation.    COORDINATION OF CARE:  9:28 AM Will prescribe antibiotic.  Patient acknowledges and agrees with plan.    Labs Review Labs Reviewed - No data to display  Imaging Review No results found. I have personally reviewed and evaluated these images and lab results as part of my medical decision-making.   EKG Interpretation None      MDM   Final diagnoses:  Cellulitis of left lower leg    9:39 AM Patient will have bactrim for wound area. Patient has tried topical treatment without relief. Patient will have recommended  PCP follow up. Vitals stable and patient afebrile.   I personally performed the services described in this documentation, which was scribed in my presence. The recorded information has been reviewed and is accurate.    Emilia BeckKaitlyn Tavares Levinson, PA-C 04/10/15 [phone]  Gilda Creasehristopher J Pollina, MD 04/10/15 1530

## 2015-04-30 ENCOUNTER — Encounter (HOSPITAL_COMMUNITY): Payer: Self-pay | Admitting: Emergency Medicine

## 2015-04-30 ENCOUNTER — Emergency Department (HOSPITAL_COMMUNITY)
Admission: EM | Admit: 2015-04-30 | Discharge: 2015-05-01 | Disposition: A | Discharge status: Home / Self Care | Payer: Self-pay | Attending: Emergency Medicine | Admitting: Emergency Medicine

## 2015-04-30 DIAGNOSIS — F17200 Nicotine dependence, unspecified, uncomplicated: Secondary | ICD-10-CM | POA: Insufficient documentation

## 2015-04-30 DIAGNOSIS — B369 Superficial mycosis, unspecified: Secondary | ICD-10-CM | POA: Insufficient documentation

## 2015-04-30 DIAGNOSIS — Z8669 Personal history of other diseases of the nervous system and sense organs: Secondary | ICD-10-CM | POA: Insufficient documentation

## 2015-04-30 DIAGNOSIS — L309 Dermatitis, unspecified: Secondary | ICD-10-CM | POA: Insufficient documentation

## 2015-04-30 LAB — I-STAT CHEM 8, ED
BUN: 12 mg/dL (ref 6–20)
CALCIUM ION: 1.09 mmol/L — AB (ref 1.12–1.23)
Chloride: 102 mmol/L (ref 101–111)
Creatinine, Ser: 1 mg/dL (ref 0.61–1.24)
Glucose, Bld: 93 mg/dL (ref 65–99)
HEMATOCRIT: 45 % (ref 39.0–52.0)
HEMOGLOBIN: 15.3 g/dL (ref 13.0–17.0)
Potassium: 4.2 mmol/L (ref 3.5–5.1)
SODIUM: 139 mmol/L (ref 135–145)
TCO2: 25 mmol/L (ref 0–100)

## 2015-04-30 LAB — CBC WITH DIFFERENTIAL/PLATELET
BASOS PCT: 0 %
Basophils Absolute: 0 10*3/uL (ref 0.0–0.1)
EOS ABS: 0.3 10*3/uL (ref 0.0–0.7)
EOS PCT: 3 %
HCT: 41 % (ref 39.0–52.0)
Hemoglobin: 13.9 g/dL (ref 13.0–17.0)
Lymphocytes Relative: 41 %
Lymphs Abs: 4.3 10*3/uL — ABNORMAL HIGH (ref 0.7–4.0)
MCH: 30.9 pg (ref 26.0–34.0)
MCHC: 33.9 g/dL (ref 30.0–36.0)
MCV: 91.1 fL (ref 78.0–100.0)
MONO ABS: 1.1 10*3/uL — AB (ref 0.1–1.0)
MONOS PCT: 11 %
Neutro Abs: 4.7 10*3/uL (ref 1.7–7.7)
Neutrophils Relative %: 45 %
Platelets: 250 10*3/uL (ref 150–400)
RBC: 4.5 MIL/uL (ref 4.22–5.81)
RDW: 12.7 % (ref 11.5–15.5)
WBC: 10.3 10*3/uL (ref 4.0–10.5)

## 2015-04-30 LAB — SEDIMENTATION RATE: Sed Rate: 22 mm/hr — ABNORMAL HIGH (ref 0–16)

## 2015-04-30 MED ORDER — FLUCONAZOLE 200 MG PO TABS: 200.0000 mg | ORAL_TABLET | Freq: Every day | ORAL | Status: AC

## 2015-04-30 MED ORDER — PREDNISONE 20 MG PO TABS: 40.0000 mg | ORAL_TABLET | Freq: Every day | ORAL | Status: AC

## 2015-04-30 MED ORDER — CLOTRIMAZOLE 1 % EX CREA: TOPICAL_CREAM | CUTANEOUS | Status: AC

## 2015-04-30 MED ORDER — FLUCONAZOLE 200 MG PO TABS
200.0000 mg | ORAL_TABLET | Freq: Every day | ORAL | Status: DC
  Administered 2015-05-01: 200 mg via ORAL
  Filled 2015-04-30: qty 1

## 2015-04-30 NOTE — ED Notes (Signed)
Pt was seen at ED 3 weeks ago and diagnosed with Cellulitis of L lower leg. Pt has been on doxycycline since then, taking as prescribed. Pt presents to ED today c/o worsening cellulitis of L lower leg. Pt sts the site has gotten progressively larger and now he has swelling at his L ankle. Pt also c/o pins and needles "all over body." since starting antibiotics. Pt also concerned about "itchy bumps" generalized over body since starting antibiotics. Pt A&Ox4 and ambulatory. Pt denies N/V/D, fevers, chills.

## 2015-04-30 NOTE — ED Provider Notes (Addendum)
CSN: [id number]     Arrival date & time 04/30/15  1915 History   First MD Initiated Contact with Patient 04/30/15 2043     Chief Complaint  Patient presents with  . Cellulitis     (Consider location/radiation/quality/duration/timing/severity/associated sxs/prior Treatment) HPI Comments: Patient is a 44 year old male who is returning today for persistent wound on the left lateral leg which is not improving with doxycycline and bacitracin. This started approximately 3 weeks ago is not getting better. She denies pain, fever or systemic symptoms. He is noticed some small lesion starting to by mouth on his arms and back that they are not getting worse or larger. It started after he try a pair of pants at a thrift store however he travels to multiple states in is not know if maybe he was hit by something. Patient initially was very itchy and now it is still slightly itchy and sore.  The history is provided by the patient.    Past Medical History  Diagnosis Date  . Sleep apnea    History reviewed. No pertinent past surgical history. No family history on file. Social History  Substance Use Topics  . Smoking status: Current Every Day Smoker -- 0.50 packs/day  . Smokeless tobacco: None  . Alcohol Use: Yes    Review of Systems  All other systems reviewed and are negative.     Allergies  Bactrim  Home Medications   Prior to Admission medications   Medication Sig Start Date End Date Taking? Authorizing Provider  HYDROcodone-acetaminophen (NORCO/VICODIN) 5-325 MG per tablet Take 1 tablet by mouth every 6 (six) hours as needed for moderate pain.    Historical Provider, MD  ibuprofen (ADVIL,MOTRIN) 800 MG tablet Take 800 mg by mouth every 8 (eight) hours as needed.    Historical Provider, MD   BP 143/101 mmHg  Pulse 94  Temp(Src) 98.1 F (36.7 C) (Oral)  Resp 18  SpO2 99% Physical Exam  Constitutional: He is oriented to person, place, and time. He appears well-developed and  well-nourished. No distress.  HENT:  Head: Normocephalic and atraumatic.  Eyes: EOM are normal. Pupils are equal, round, and reactive to light.  Cardiovascular: Normal rate.   Pulmonary/Chest: Effort normal.  Musculoskeletal: Normal range of motion.  Neurological: He is alert and oriented to person, place, and time.  Skin: Skin is warm and dry.     Small punctate lesions sparsely scattered over the upper arms and back  Psychiatric: He has a normal mood and affect.  Nursing note and vitals reviewed.   ED Course  Procedures (including critical care time) Labs Review Labs Reviewed  CBC WITH DIFFERENTIAL/PLATELET - Abnormal; Notable for the following:    Lymphs Abs 4.3 (*)    Monocytes Absolute 1.1 (*)    All other components within normal limits  SEDIMENTATION RATE - Abnormal; Notable for the following:    Sed Rate 22 (*)    All other components within normal limits  I-STAT CHEM 8, ED - Abnormal; Notable for the following:    Calcium, Ion 1.09 (*)    All other components within normal limits    Imaging Review No results found. I have personally reviewed and evaluated these images and lab results as part of my medical decision-making.   EKG Interpretation None      MDM   Final diagnoses:  Fungal dermatitis    Patient is a 44 year old male who has a history of worsening lesions to the left lateral leg. He was seen  3 weeks ago and treated with doxycycline.  Patient's wound is not healing. Concerned that this may be a bite as it initially started with severe itching does not improve. Low suspicion for secondary bacterial infection at this time. Will treat with steroids of hydrocortisone cream. Symptoms do not improve he was given referral to dermatology  11:10 PM Labs without acute findings except for mildly elevated ESR. Significant other arrived and she has a better history stating the area initially look like ringworm with a circular red raised border with normal skin in  the middle which is now changed to the parents today. We'll initially treat Diflucan and Lotrimin. It does not improve in one week will switch to steroids.  Blanchie Dessert, MD 04/30/15 2840  Blanchie Dessert, MD 04/30/15 6986

## 2015-04-30 NOTE — Progress Notes (Addendum)
EDCM spoke to patient at bedside. Patient confirms he [place] a pcp [place]  [facility], informed patient of services there.  EDCM also provided patient with list of pcps who accept self pay patients, list of discount pharmacies and websites needymeds.org and GoodRX.com for medication assistance, phone number to inquire about the orange card, phone number to inquire about Mediciad, phone number to inquire about the Affordable Care Act, financial resources in the community such as local churches, salvation [employer], urban ministries, and dental assistance for uninsured patients.  Patient thankful for resources.  No further EDCM needs at this time.  Patient agreeable to [facility] referral for enrollment of orange card.  [facility] referral sent.

## 2015-05-05 ENCOUNTER — Telehealth (HOSPITAL_BASED_OUTPATIENT_CLINIC_OR_DEPARTMENT_OTHER): Payer: Self-pay | Admitting: Emergency Medicine

## 2015-07-15 ENCOUNTER — Emergency Department (HOSPITAL_COMMUNITY)
Admission: EM | Admit: 2015-07-15 | Discharge: 2015-07-15 | Disposition: A | Discharge status: Home / Self Care | Payer: Self-pay | Attending: Emergency Medicine | Admitting: Emergency Medicine

## 2015-07-15 ENCOUNTER — Encounter (HOSPITAL_COMMUNITY): Payer: Self-pay | Admitting: Emergency Medicine

## 2015-07-15 DIAGNOSIS — K0889 Other specified disorders of teeth and supporting structures: Secondary | ICD-10-CM

## 2015-07-15 DIAGNOSIS — Z79899 Other long term (current) drug therapy: Secondary | ICD-10-CM | POA: Insufficient documentation

## 2015-07-15 DIAGNOSIS — K047 Periapical abscess without sinus: Secondary | ICD-10-CM | POA: Insufficient documentation

## 2015-07-15 DIAGNOSIS — Z8669 Personal history of other diseases of the nervous system and sense organs: Secondary | ICD-10-CM | POA: Insufficient documentation

## 2015-07-15 DIAGNOSIS — F17200 Nicotine dependence, unspecified, uncomplicated: Secondary | ICD-10-CM | POA: Insufficient documentation

## 2015-07-15 MED ORDER — HYDROCODONE-ACETAMINOPHEN 5-325 MG PO TABS: 1.0000 | ORAL_TABLET | Freq: Four times a day (QID) | ORAL | Status: AC | PRN

## 2015-07-15 MED ORDER — CEFTRIAXONE SODIUM 1 G IJ SOLR
1.0000 g | Freq: Once | INTRAMUSCULAR | Status: AC
  Administered 2015-07-15: 1 g via INTRAMUSCULAR
  Filled 2015-07-15: qty 10

## 2015-07-15 MED ORDER — AMOXICILLIN 500 MG PO CAPS: 500.0000 mg | ORAL_CAPSULE | Freq: Three times a day (TID) | ORAL | Status: AC

## 2015-07-15 NOTE — ED Provider Notes (Signed)
CSN: [id number]   [id number] date & time 07/15/15  1542 History  By signing my name below, I, Elon Spanner, attest that this documentation has been prepared under the direction and in the presence of Kerrie Buffalo, NP. Electronically Signed: Elon Spanner ED Scribe. 07/15/2015. 4:38 PM.    Chief Complaint  Patient presents with  . Dental Pain   Patient is a 45 y.o. male presenting with tooth pain. The history is provided by the patient. No language interpreter was used.  Dental Pain Location:  Upper Severity:  Moderate Onset quality:  Gradual Duration:  1 day Timing:  Constant Progression:  Worsening Chronicity:  New Context: abscess and dental fracture (45 year old)   Relieved by:  Nothing Worsened by:  Touching Ineffective treatments:  NSAIDs Associated symptoms: facial swelling   Risk factors: smoking    HPI Comments: Eric Acevedo is a 45 y.o. male who presents to the Emergency Department complaining of constant, moderate, gradually worsening left upper dental pain onset yesterday; unrelieved with ibuprofen  He notes some minimal poor tasting drainage.  The patient reports he cracked his wisdom left upper wisdom tooth 1 year ago without complication until the onset of today's complaint.  Patient has a dentist but has not been evaluated for this complaint.  He denies chronic medical conditions and takes no medications regularly.  He denies fever, chills, ear pain, sore throat.  The patient reports an allergy to ibuprofen and sulfa.          Past Medical History  Diagnosis Date  . Sleep apnea    History reviewed. No pertinent past surgical history. No family history on file. Social History  Substance Use Topics  . Smoking status: Current Every Day Smoker -- 0.50 packs/day  . Smokeless tobacco: None  . Alcohol Use: Yes    Review of Systems  HENT: Positive for facial swelling.    A complete 10 system review of systems was obtained and all systems are negative except as noted in  the HPI and PMH.   Allergies  Bactrim and Sulfa antibiotics  Home Medications   Prior to Admission medications   Medication Sig Start Date End Date Taking? Authorizing Provider  amoxicillin (AMOXIL) 500 MG capsule Take 1 capsule (500 mg total) by mouth 3 (three) times daily. 07/15/15   Hope Orlene Och, NP  clotrimazole (LOTRIMIN) 1 % cream Apply to affected area 2 times daily 04/30/15   Gwyneth Sprout, MD  doxycycline (VIBRAMYCIN) 100 MG capsule Take 100 mg by mouth 2 (two) times daily. ABT Start Date 04/26/15 & End 05/09/15.    Historical Provider, MD  HYDROcodone-acetaminophen (NORCO) 5-325 MG tablet Take 1 tablet by mouth every 6 (six) hours as needed. 07/15/15   Hope Orlene Och, NP  ibuprofen (ADVIL,MOTRIN) 200 MG tablet Take 400 mg by mouth every 6 (six) hours as needed for moderate pain.    Historical Provider, MD  predniSONE (DELTASONE) 20 MG tablet Take 2 tablets (40 mg total) by mouth daily. Take only if antifungal medications do not work in 1 week 04/30/15   Gwyneth Sprout, MD   BP 151/92 mmHg  Pulse 98  Temp(Src) 98.3 F (36.8 C) (Oral)  Resp 20  SpO2 100% Physical Exam  Constitutional: He is oriented to person, place, and time. He appears well-developed and well-nourished. No distress.  HENT:  Head: Normocephalic and atraumatic.  Left upper 3rd molar is broken.  Swelling around the gum.  Small abscess noted to the inside of  the left cheek.  Tonsils enlarged.  Uvula midline but no edema.    Eyes: Conjunctivae and EOM are normal.  Neck: Neck supple. No tracheal deviation present.  Cardiovascular: Normal rate.   Pulmonary/Chest: Effort normal. No respiratory distress.  Musculoskeletal: Normal range of motion.  Lymphadenopathy:    He has cervical adenopathy (bilateral).  Neurological: He is alert and oriented to person, place, and time.  Skin: Skin is warm and dry.  Psychiatric: He has a normal mood and affect. His behavior is normal.  Nursing note and vitals reviewed.   ED  Course  Procedures (including critical care time)  DIAGNOSTIC STUDIES: Oxygen Saturation is 96% on RA, adequate by my interpretation.    COORDINATION OF CARE:  4:44 PM Discussed plan to order pain medication and antibiotic.  Patient should f/u with dentist.  Return precautions advised.    MDM  46 y.o. male with dental pain due to abscess stable for d/c without fever, trismus or difficulty swallowing. Will treat with antibiotics and pain medication and he will follow up with his dentist. Discussed with the patient and all questioned fully answered. He will return here if any problems arise.   Final diagnoses:  Dental abscess  Pain, dental    I personally performed the services described in this documentation, which was scribed in my presence. The recorded information has been reviewed and is accurate.    [address] [place], [place] 07/16/15 1445  Nelva Nay, MD 07/19/15 [phone]

## 2015-07-15 NOTE — Discharge Instructions (Signed)

## 2015-07-15 NOTE — ED Notes (Signed)
Pt reports that he noticed an area in the left side of his mouth come up over night. Pt has a red swollen area o his left cheek with a small white center.  Pt alert x4. ND at this time.

## 2017-10-28 ENCOUNTER — Encounter (HOSPITAL_COMMUNITY): Payer: Self-pay

## 2017-10-28 ENCOUNTER — Other Ambulatory Visit: Payer: Self-pay

## 2017-10-28 ENCOUNTER — Emergency Department (HOSPITAL_COMMUNITY)
Admission: EM | Admit: 2017-10-28 | Discharge: 2017-10-28 | Disposition: A | Discharge status: Home / Self Care | Payer: Self-pay | Attending: Emergency Medicine | Admitting: Emergency Medicine

## 2017-10-28 ENCOUNTER — Emergency Department (HOSPITAL_COMMUNITY): Payer: Self-pay

## 2017-10-28 DIAGNOSIS — R062 Wheezing: Secondary | ICD-10-CM | POA: Insufficient documentation

## 2017-10-28 DIAGNOSIS — Z720 Tobacco use: Secondary | ICD-10-CM

## 2017-10-28 DIAGNOSIS — R05 Cough: Secondary | ICD-10-CM

## 2017-10-28 DIAGNOSIS — F17210 Nicotine dependence, cigarettes, uncomplicated: Secondary | ICD-10-CM | POA: Insufficient documentation

## 2017-10-28 DIAGNOSIS — M7918 Myalgia, other site: Secondary | ICD-10-CM | POA: Insufficient documentation

## 2017-10-28 DIAGNOSIS — R059 Cough, unspecified: Secondary | ICD-10-CM

## 2017-10-28 DIAGNOSIS — J40 Bronchitis, not specified as acute or chronic: Secondary | ICD-10-CM | POA: Insufficient documentation

## 2017-10-28 MED ORDER — IPRATROPIUM BROMIDE 0.02 % IN SOLN
0.5000 mg | Freq: Once | RESPIRATORY_TRACT | Status: AC
  Administered 2017-10-28: 0.5 mg via RESPIRATORY_TRACT
  Filled 2017-10-28: qty 2.5

## 2017-10-28 MED ORDER — ALBUTEROL SULFATE HFA 108 (90 BASE) MCG/ACT IN AERS
2.0000 | INHALATION_SPRAY | Freq: Once | RESPIRATORY_TRACT | Status: AC
  Administered 2017-10-28: 2 via RESPIRATORY_TRACT
  Filled 2017-10-28: qty 6.7

## 2017-10-28 MED ORDER — ALBUTEROL SULFATE (2.5 MG/3ML) 0.083% IN NEBU
5.0000 mg | INHALATION_SOLUTION | Freq: Once | RESPIRATORY_TRACT | Status: AC
  Administered 2017-10-28: 5 mg via RESPIRATORY_TRACT
  Filled 2017-10-28: qty 6

## 2017-10-28 MED ORDER — PREDNISONE 20 MG PO TABS
60.0000 mg | ORAL_TABLET | Freq: Once | ORAL | Status: AC
  Administered 2017-10-28: 60 mg via ORAL
  Filled 2017-10-28: qty 3

## 2017-10-28 MED ORDER — ALBUTEROL SULFATE HFA 108 (90 BASE) MCG/ACT IN AERS: 1.0000 | INHALATION_SPRAY | RESPIRATORY_TRACT | 0 refills | Status: AC | PRN

## 2017-10-28 MED ORDER — PREDNISONE 20 MG PO TABS: ORAL_TABLET | ORAL | 0 refills | Status: AC

## 2017-10-28 NOTE — ED Triage Notes (Signed)
He c/o uri sx x 1 week. He is in no distress.

## 2017-10-28 NOTE — ED Provider Notes (Signed)
[facility]-EMERGENCY DEPT Provider Note   CSN: [id number] Arrival date & time: 10/28/17  1652     History   Chief Complaint Chief Complaint  Patient presents with  . URI    HPI Eric Acevedo is a 47 y.o. male with a PMHx of sleep apnea, who presents to the ED with complaints of "chest cold" for the last 1 week.  He reports having a cough with yellow-white sputum production, wheezing, and body aches for about 1 week.  He has tried Robitussin and NyQuil with no relief of the symptoms, no known aggravating factors.  He admits to being a cigarette smoker.  He denies any history of COPD or asthma, but states that he has needed an inhaler before in the past when he had similar symptoms.  He denies any known sick contacts.  He denies any rhinorrhea, ear pain or drainage, sore throat, fevers, chills, CP, SOB, abd pain, N/V/D/C, hematuria, dysuria, arthralgias, numbness, tingling, focal weakness, or any other complaints at this time.   The history is provided by the patient and medical records. No language interpreter was used.  URI   Associated symptoms include cough and wheezing. Pertinent negatives include no chest pain, no abdominal pain, no diarrhea, no nausea, no vomiting, no dysuria, no ear pain, no rhinorrhea and no sore throat.    Past Medical History:  Diagnosis Date  . Sleep apnea     Patient Active Problem List   Diagnosis Date Noted  . OBSTRUCTIVE SLEEP APNEA 08/20/2009  . SYNCOPE AND COLLAPSE 07/07/2009    No past surgical history on file.      Home Medications    Prior to Admission medications   Medication Sig Start Date End Date Taking? Authorizing Provider  amoxicillin (AMOXIL) 500 MG capsule Take 1 capsule (500 mg total) by mouth 3 (three) times daily. 07/15/15   Janne Napoleon, NP  clotrimazole (LOTRIMIN) 1 % cream Apply to affected area 2 times daily 04/30/15   Gwyneth Sprout, MD  doxycycline (VIBRAMYCIN) 100 MG capsule Take 100 mg by  mouth 2 (two) times daily. ABT Start Date 04/26/15 & End 05/09/15.    [provider]  HYDROcodone-acetaminophen (NORCO) 5-325 MG tablet Take 1 tablet by mouth every 6 (six) hours as needed. 07/15/15   Janne Napoleon, NP  ibuprofen (ADVIL,MOTRIN) 200 MG tablet Take 400 mg by mouth every 6 (six) hours as needed for moderate pain.    [provider]  predniSONE (DELTASONE) 20 MG tablet Take 2 tablets (40 mg total) by mouth daily. Take only if antifungal medications do not work in 1 week 04/30/15   Gwyneth Sprout, MD    Family History No family history on file.  Social History Social History   Tobacco Use  . Smoking status: Current Every Day Smoker    Packs/day: 0.50  . Smokeless tobacco: Never Used  Substance Use Topics  . Alcohol use: Yes  . Drug use: No     Allergies   Bactrim [sulfamethoxazole-trimethoprim] and Sulfa antibiotics   Review of Systems Review of Systems  Constitutional: Negative for chills and fever.  HENT: Negative for ear discharge, ear pain, rhinorrhea and sore throat.   Respiratory: Positive for cough and wheezing. Negative for shortness of breath.   Cardiovascular: Negative for chest pain.  Gastrointestinal: Negative for abdominal pain, constipation, diarrhea, nausea and vomiting.  Genitourinary: Negative for dysuria and hematuria.  Musculoskeletal: Positive for myalgias (body aches). Negative for arthralgias.  Skin: Negative for  color change.  Allergic/Immunologic: Negative for immunocompromised state.  Neurological: Negative for weakness and numbness.  Psychiatric/Behavioral: Negative for confusion.   All other systems reviewed and are negative for acute change except as noted in the HPI.    Physical Exam Updated Vital Signs BP (!) 150/97 (BP Location: Right Arm)   Pulse 97   Temp 98.4 F (36.9 C) (Oral)   Resp 18   SpO2 95%   Physical Exam  Constitutional: He is oriented to person, place, and time. Vital signs are normal. He  appears well-developed and well-nourished.  Non-toxic appearance. No distress.  Afebrile, nontoxic, NAD  HENT:  Head: Normocephalic and atraumatic.  Right Ear: Hearing, tympanic membrane, external ear and ear canal normal.  Left Ear: Hearing, tympanic membrane, external ear and ear canal normal.  Nose: Nose normal.  Mouth/Throat: Uvula is midline, oropharynx is clear and moist and mucous membranes are normal. No trismus in the jaw. No uvula swelling. Tonsils are 0 on the right. Tonsils are 0 on the left. No tonsillar exudate.  Ears are clear bilaterally. Nose clear. Oropharynx clear and moist, without uvular swelling or deviation, no trismus or drooling, no tonsillar swelling or erythema, no exudates.    Eyes: Conjunctivae and EOM are normal. Right eye exhibits no discharge. Left eye exhibits no discharge.  Neck: Normal range of motion. Neck supple.  Cardiovascular: Normal rate, regular rhythm, normal heart sounds and intact distal pulses. Exam reveals no gallop and no friction rub.  No murmur heard. Pulmonary/Chest: Effort normal. No respiratory distress. He has no decreased breath sounds. He has wheezes. He has rhonchi. He has no rales.  Harsh cough during exam. Faint expiratory wheezing throughout all lung fields, with scattered rhonchi throughout, no rales appreciated, no hypoxia or increased WOB, speaking in full sentences, SpO2 95% on RA   Abdominal: Soft. Normal appearance and bowel sounds are normal. He exhibits no distension. There is no tenderness. There is no rigidity, no rebound, no guarding, no CVA tenderness, no tenderness at McBurney's point and negative Murphy's sign.  Musculoskeletal: Normal range of motion.  Neurological: He is alert and oriented to person, place, and time. He has normal strength. No sensory deficit.  Skin: Skin is warm, dry and intact. No rash noted.  Psychiatric: He has a normal mood and affect.  Nursing note and vitals reviewed.    ED Treatments / Results   Labs (all labs ordered are listed, but only abnormal results are displayed) Labs Reviewed - No data to display  EKG None  Radiology Dg Chest 2 View  Result Date: 10/28/2017 CLINICAL DATA:  Productive cough.  Smoker. EXAM: CHEST - 2 VIEW COMPARISON:  Chest radiograph April 30, 2009 FINDINGS: Cardiomediastinal silhouette is normal. No pleural effusions or focal consolidations. Mild bronchitic changes. Trachea projects midline and there is no pneumothorax. Soft tissue planes and included osseous structures are non-suspicious. Large body habitus. IMPRESSION: Mild bronchitic changes without focal consolidation. Electronically Signed   By: Awilda Metro M.D.   On: 10/28/2017 18:50    Procedures Procedures (including critical care time)  Medications Ordered in ED Medications  predniSONE (DELTASONE) tablet 60 mg (60 mg Oral Given 10/28/17 1911)  albuterol (PROVENTIL) (2.5 MG/3ML) 0.083% nebulizer solution 5 mg (5 mg Nebulization Given 10/28/17 1820)  ipratropium (ATROVENT) nebulizer solution 0.5 mg (0.5 mg Nebulization Given 10/28/17 1820)  albuterol (PROVENTIL) (2.5 MG/3ML) 0.083% nebulizer solution 5 mg (5 mg Nebulization Given 10/28/17 2004)  ipratropium (ATROVENT) nebulizer solution 0.5 mg (0.5 mg Nebulization Given  10/28/17 2004)  albuterol (PROVENTIL HFA;VENTOLIN HFA) 108 (90 Base) MCG/ACT inhaler 2 puff (2 puffs Inhalation Given 10/28/17 2102)     Initial Impression / Assessment and Plan / ED Course  I have reviewed the triage vital signs and the nursing notes.  Pertinent labs & imaging results that were available during my care of the patient were reviewed by me and considered in my medical decision making (see chart for details).     47 y.o. male here with cough x1wk and associated wheezing. On exam, diffuse expiratory wheezing throughout with scattered course rhonchi, no hypoxia or increased WOB. Throat and ears clear. Will get CXR, give prednisone and duoneb, and reassess  shortly.   8:00 PM CXR with mild bronchitic changes without focal consolidation. Lung sounds slightly improved after first duoneb, however still with some wheezing; will repeat this now. will reassess shortly.  8:59 PM Lung sounds greatly improved with 2nd duoneb. Pt feeling better. Overall, symptoms c/w bronchitis, probably viral in etiology, doubt need for empiric abx at this point. Will send home with inhaler and refill rx, prednisone x4 days starting tomorrow, advised use of daily antihistamine, and other OTC remedies for symptomatic relief. F/up with PCP in 5-7 days for recheck of symptoms. Smoking cessation strongly advised.  I explained the diagnosis and have given explicit precautions to return to the ER including for any other new or worsening symptoms. The patient understands and accepts the medical plan as it's been dictated and I have answered their questions. Discharge instructions concerning home care and prescriptions have been given. The patient is STABLE and is discharged to home in good condition.    Final Clinical Impressions(s) / ED Diagnoses   Final diagnoses:  Bronchitis  Cough  Tobacco user    ED Discharge Orders        Ordered    predniSONE (DELTASONE) 20 MG tablet     10/28/17 2054    albuterol (PROVENTIL HFA;VENTOLIN HFA) 108 (90 Base) MCG/ACT inhaler  Every 4 hours PRN     10/28/17 [address], [place], [place] 10/28/17 2103    Cathren Laine, MD 10/28/17 2336

## 2017-10-28 NOTE — Discharge Instructions (Addendum)
Continue to stay well-hydrated. Use Mucinex for cough suppression/expectoration of mucus. Use over the counter antihistamines such as zyrtec, claritin, or allegra to decrease symptoms. Use inhaler as directed, as needed for cough/chest congestion/wheezing/shortness of breath/etc. Take prednisone as directed until completed, starting tomorrow since you received today's dose in the ER today. STOP SMOKING! Follow-up with your primary care doctor in 5-7 days for recheck of ongoing symptoms. Return to emergency department for emergent changing or worsening of symptoms.

## 2017-10-28 NOTE — ED Notes (Signed)
PT DISCHARGED. INSTRUCTIONS AND PRESCRIPTIONS GIVEN. AAOX4. PT IN NO APPARENT DISTRESS OR PAIN. THE OPPORTUNITY TO ASK QUESTIONS WAS PROVIDED.
